# Patient Record
Sex: Male | Born: 1995 | Race: White | Hispanic: No | Marital: Single
Health system: Southern US, Community
[De-identification: ages and names within clinical notes are randomized; demographics above are authoritative.]

---
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
## 2014-03-27 ENCOUNTER — Emergency Department: Payer: Self-pay | Admitting: Emergency Medicine

## 2019-08-11 ENCOUNTER — Ambulatory Visit: Payer: 59 | Attending: Oncology

## 2019-08-11 DIAGNOSIS — Z23 Encounter for immunization: Secondary | ICD-10-CM

## 2019-08-11 NOTE — Progress Notes (Signed)
   Covid-19 Vaccination Clinic  Name:  Yogesh Cominsky    MRN: [id number] DOB: 1996-04-14  08/11/2019  Mr. Tonkinson was observed post Covid-19 immunization for 15 minutes without incident. He was provided with Vaccine Information Sheet and instruction to access the V-Safe system.   Mr. Shrout was instructed to call 911 with any severe reactions post vaccine: Marland Kitchen Difficulty breathing  . Swelling of face and throat  . A fast heartbeat  . A bad rash all over body  . Dizziness and weakness   Immunizations Administered    Name Date Dose VIS Date Route   Pfizer COVID-19 Vaccine 08/11/2019  1:18 PM 0.3 mL 04/13/2019 Intramuscular   Manufacturer: [employer], [employer]   Lot: [id number]   NDC: [address]-0

## 2019-09-04 ENCOUNTER — Ambulatory Visit: Payer: 59 | Attending: Internal Medicine

## 2019-09-04 DIAGNOSIS — Z23 Encounter for immunization: Secondary | ICD-10-CM

## 2019-09-04 NOTE — Progress Notes (Signed)
   Covid-19 Vaccination Clinic  Name:  Curtis Huber    MRN: [id number] DOB: Apr 25, 1996  09/04/2019  Curtis Huber was observed post Covid-19 immunization for 15 minutes without incident. He was provided with Vaccine Information Sheet and instruction to access the V-Safe system.   Curtis Huber was instructed to call 911 with any severe reactions post vaccine: Marland Kitchen Difficulty breathing  . Swelling of face and throat  . A fast heartbeat  . A bad rash all over body  . Dizziness and weakness   Immunizations Administered    Name Date Dose VIS Date Route   Pfizer COVID-19 Vaccine 09/04/2019  3:08 PM 0.3 mL 06/27/2018 Intramuscular   Manufacturer: [employer], [employer]   Lot: [id number]   NDC: [address]-3

## 2021-02-23 ENCOUNTER — Telehealth: Payer: Self-pay | Admitting: Family

## 2021-02-23 DIAGNOSIS — J209 Acute bronchitis, unspecified: Secondary | ICD-10-CM

## 2021-02-23 MED ORDER — BENZONATATE 100 MG PO CAPS
100.0000 mg | ORAL_CAPSULE | Freq: Three times a day (TID) | ORAL | 0 refills | Status: DC | PRN
Start: 2021-02-23 — End: 2023-01-18

## 2021-02-23 MED ORDER — PREDNISONE 10 MG (21) PO TBPK: ORAL_TABLET | ORAL | 0 refills | Status: AC

## 2021-02-23 NOTE — Progress Notes (Signed)

## 2021-02-24 ENCOUNTER — Encounter: Payer: Self-pay | Admitting: Physician Assistant

## 2023-01-18 ENCOUNTER — Telehealth: Payer: 59 | Admitting: Physician Assistant

## 2023-01-18 DIAGNOSIS — J069 Acute upper respiratory infection, unspecified: Secondary | ICD-10-CM

## 2023-01-18 MED ORDER — BENZONATATE 100 MG PO CAPS: 100.0000 mg | ORAL_CAPSULE | Freq: Three times a day (TID) | ORAL | 0 refills | Status: AC | PRN

## 2023-01-18 NOTE — Progress Notes (Signed)
I have spent 5 minutes in review of e-visit questionnaire, review and updating patient chart, medical decision making and response to patient.   Mia Milan Cody Jacklynn Dehaas, PA-C    

## 2023-01-18 NOTE — Progress Notes (Signed)
E-Visit for Upper Respiratory Infection   We are sorry you are not feeling well.  Here is how we plan to help!  Based on what you have shared with me, it looks like you may have a viral upper respiratory infection.  Upper respiratory infections are caused by a large number of viruses; however, rhinovirus is the most common cause.   Symptoms vary from person to person, with common symptoms including sore throat, cough, fatigue or lack of energy and feeling of general discomfort.  A low-grade fever of up to 100.4 may present, but is often uncommon.  Symptoms vary however, and are closely related to a person's age or underlying illnesses.  The most common symptoms associated with an upper respiratory infection are nasal discharge or congestion, cough, sneezing, headache and pressure in the ears and face.  These symptoms usually persist for about 3 to 10 days, but can last up to 2 weeks.  It is important to know that upper respiratory infections do not cause serious illness or complications in most cases.    Upper respiratory infections can be transmitted from person to person, with the most common method of transmission being a person's hands.  The virus is able to live on the skin and can infect other persons for up to 2 hours after direct contact.  Also, these can be transmitted when someone coughs or sneezes; thus, it is important to cover the mouth to reduce this risk.  To keep the spread of the illness at bay, good hand hygiene is very important.  This is an infection that is most likely caused by a virus. There are no specific treatments other than to help you with the symptoms until the infection runs its course.  We are sorry you are not feeling well.  Here is how we plan to help!   For nasal congestion, you may use an oral decongestants such as Mucinex D or if you have glaucoma or high blood pressure use plain Mucinex.  Saline nasal spray or nasal drops can help and can safely be used as often as  needed for congestion.   If you do not have a history of heart disease, hypertension, diabetes or thyroid disease, prostate/bladder issues or glaucoma, you may also use Sudafed to treat nasal congestion.  It is highly recommended that you consult with a pharmacist or your primary care physician to ensure this medication is safe for you to take.     If you have a cough, you may use cough suppressants such as Delsym and Robitussin.  If you have glaucoma or high blood pressure, you can also use Coricidin HBP.   For cough I have prescribed for you A prescription cough medication called Tessalon Perles 100 mg. You may take 1-2 capsules every 8 hours as needed for cough  If you have a sore or scratchy throat, use a saltwater gargle-  to  teaspoon of salt dissolved in a 4-ounce to 8-ounce glass of warm water.  Gargle the solution for approximately 15-30 seconds and then spit.  It is important not to swallow the solution.  You can also use throat lozenges/cough drops and Chloraseptic spray to help with throat pain or discomfort.  Warm or cold liquids can also be helpful in relieving throat pain.  For headache, pain or general discomfort, you can use Ibuprofen or Tylenol as directed.   Some authorities believe that zinc sprays or the use of Echinacea may shorten the course of your symptoms.  We  cannot provide an excuse for previous dates but I can supply a note to cover for today and to include tomorrow if needed.  I have sent a work note to Pharmacologist. You can find by going to the Menu on your homepage, scrolling down to the Communications section, and selecting Letters. Let us know if you have any issue locating. Take care and feel better soon!    HOME CARE Only take medications as instructed by your medical team. Be sure to drink plenty of fluids. Water is fine as well as fruit juices, sodas and electrolyte beverages. You may want to stay away from caffeine or alcohol. If you are nauseated, try  taking small sips of liquids. How do you know if you are getting enough fluid? Your urine should be a pale yellow or almost colorless. Get rest. Taking a steamy shower or using a humidifier may help nasal congestion and ease sore throat pain. You can place a towel over your head and breathe in the steam from hot water coming from a faucet. Using a saline nasal spray works much the same way. Cough drops, hard candies and sore throat lozenges may ease your cough. Avoid close contacts especially the very young and the elderly Cover your mouth if you cough or sneeze Always remember to wash your hands.   GET HELP RIGHT AWAY IF: You develop worsening fever. If your symptoms do not improve within 10 days You develop yellow or green discharge from your nose over 3 days. You have coughing fits You develop a severe head ache or visual changes. You develop shortness of breath, difficulty breathing or start having chest pain Your symptoms persist after you have completed your treatment plan  MAKE SURE YOU  Understand these instructions. Will watch your condition. Will get help right away if you are not doing well or get worse.  Thank you for choosing an e-visit.  Your e-visit answers were reviewed by a board certified advanced clinical practitioner to complete your personal care plan. Depending upon the condition, your plan could have included both over the counter or prescription medications.  Please review your pharmacy choice. Make sure the pharmacy is open so you can pick up prescription now. If there is a problem, you may contact your provider through [employer] and have the prescription routed to another pharmacy.  Your safety is important to [place]. If you have drug allergies check your prescription carefully.   For the next 24 hours you can use MyChart to ask questions about today's visit, request a non-urgent call back, or ask for a work or school excuse. You will get an email in the  next two days asking about your experience. I hope that your e-visit has been valuable and will speed your recovery.
